# Patient Record
Sex: Male | Born: 1985 | Race: Black or African American | Hispanic: No | Marital: Married | Smoking: Current every day smoker
Health system: Southern US, Community
[De-identification: ages and names within clinical notes are randomized; demographics above are authoritative.]

## PROBLEM LIST (undated history)

## (undated) DIAGNOSIS — K219 Gastro-esophageal reflux disease without esophagitis: Secondary | ICD-10-CM

## (undated) DIAGNOSIS — G43909 Migraine, unspecified, not intractable, without status migrainosus: Secondary | ICD-10-CM

---
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
## 2012-12-02 ENCOUNTER — Emergency Department: Payer: Self-pay | Admitting: Emergency Medicine

## 2013-03-07 ENCOUNTER — Emergency Department: Payer: Self-pay | Admitting: Emergency Medicine

## 2013-12-08 ENCOUNTER — Emergency Department: Payer: Self-pay | Admitting: Emergency Medicine

## 2014-01-17 ENCOUNTER — Emergency Department: Payer: Self-pay | Admitting: Emergency Medicine

## 2014-04-14 ENCOUNTER — Emergency Department: Payer: Self-pay | Admitting: Emergency Medicine

## 2015-09-26 ENCOUNTER — Encounter: Payer: Self-pay | Admitting: Emergency Medicine

## 2015-09-26 ENCOUNTER — Emergency Department
Admission: EM | Admit: 2015-09-26 | Discharge: 2015-09-26 | Disposition: A | Discharge status: Home / Self Care | Payer: Self-pay | Attending: Emergency Medicine | Admitting: Emergency Medicine

## 2015-09-26 ENCOUNTER — Emergency Department: Payer: Self-pay

## 2015-09-26 DIAGNOSIS — F17210 Nicotine dependence, cigarettes, uncomplicated: Secondary | ICD-10-CM | POA: Insufficient documentation

## 2015-09-26 DIAGNOSIS — J069 Acute upper respiratory infection, unspecified: Secondary | ICD-10-CM

## 2015-09-26 HISTORY — DX: Migraine, unspecified, not intractable, without status migrainosus: G43.909

## 2015-09-26 LAB — CBC
HEMATOCRIT: 47 % (ref 40.0–52.0)
HEMOGLOBIN: 15.6 g/dL (ref 13.0–18.0)
MCH: 28.8 pg (ref 26.0–34.0)
MCHC: 33.2 g/dL (ref 32.0–36.0)
MCV: 86.9 fL (ref 80.0–100.0)
Platelets: 180 10*3/uL (ref 150–440)
RBC: 5.41 MIL/uL (ref 4.40–5.90)
RDW: 14.5 % (ref 11.5–14.5)
WBC: 9.2 10*3/uL (ref 3.8–10.6)

## 2015-09-26 LAB — BASIC METABOLIC PANEL
ANION GAP: 7 (ref 5–15)
BUN: 8 mg/dL (ref 6–20)
CHLORIDE: 107 mmol/L (ref 101–111)
CO2: 23 mmol/L (ref 22–32)
Calcium: 9.1 mg/dL (ref 8.9–10.3)
Creatinine, Ser: 0.85 mg/dL (ref 0.61–1.24)
GFR calc Af Amer: >60 mL/min (ref >60)
GLUCOSE: 86 mg/dL (ref 65–99)
POTASSIUM: 3.7 mmol/L (ref 3.5–5.1)
SODIUM: 137 mmol/L (ref 135–145)

## 2015-09-26 LAB — TROPONIN I: Troponin I: <0.03 ng/mL (ref <0.031)

## 2015-09-26 MED ORDER — ALBUTEROL SULFATE HFA 108 (90 BASE) MCG/ACT IN AERS
2.0000 | INHALATION_SPRAY | Freq: Four times a day (QID) | RESPIRATORY_TRACT | Status: AC | PRN
Start: 2015-09-26 — End: ?

## 2015-09-26 NOTE — ED Notes (Signed)
Pt ambulatory to restroom with steady gait noted  

## 2015-09-26 NOTE — ED Notes (Signed)
Discharge instructions reviewed with patient. Questions fielded by this RN. Patient verbalizes understanding of instructions. Patient discharged home in stable condition per Mcshane MD . No acute distress noted at time of discharge.   

## 2015-09-26 NOTE — ED Provider Notes (Signed)
[facility] Emergency Department Provider Note  ____________________________________________   I have reviewed the triage vital signs and the nursing notes.   HISTORY  Chief Complaint Chest Pain    HPI Gordon Dunn is a 30 y.o. male presents today with cough runny nose slight headache and nasal congestion for the last 3 days. He states he needs a work note.He states it hurts sometimes when he coughs.      Past Medical History  Diagnosis Date  . Migraines     There are no active problems to display for this patient.   History reviewed. No pertinent past surgical history.  No current outpatient prescriptions on file.  Allergies Review of patient's allergies indicates no known allergies.  No family history on file.  Social History Social History  Substance Use Topics  . Smoking status: Current Every Day Smoker -- 0.50 packs/day    Types: Cigarettes  . Smokeless tobacco: None  . Alcohol Use: No    Review of Systems Constitutional: No fever/chills Eyes: No visual changes. ENT: No sore throat. No stiff neck no neck pain Cardiovascular:See history of present illness. Respiratory: Denies shortness of breath. Gastrointestinal:   no vomiting.  No diarrhea.  No constipation. Genitourinary: Negative for dysuria. Musculoskeletal: Negative lower extremity swelling Skin: Negative for rash. Neurological: Negative for headaches, focal weakness or numbness. 10-point ROS otherwise negative.  ____________________________________________   PHYSICAL EXAM:  VITAL SIGNS: ED Triage Vitals  Enc Vitals Group     BP 09/26/15 1625 146/109 mmHg     Pulse Rate 09/26/15 1625 85     Resp 09/26/15 1625 18     Temp 09/26/15 1625 98.8 F (37.1 C)     Temp Source 09/26/15 1625 Oral     SpO2 09/26/15 1625 97 %     Weight 09/26/15 1625 165 lb (74.844 kg)     Height 09/26/15 1625 5\' 9"  (1.753 m)     Head Cir --      Peak Flow --      Pain Score  09/26/15 1626 4     Pain Loc --      Pain Edu? --      Excl. in GC? --     Constitutional: Alert and oriented. Well appearing and in no acute distress. Eyes: Conjunctivae are normal. PERRL. EOMI. Head: Atraumatic. Nose: Positive clear congestion/rhinnorhea. Mouth/Throat: Mucous membranes are moist.  Oropharynx non-erythematous. Neck: No stridor.   Nontender with no meningismus Cardiovascular: Normal rate, regular rhythm. Grossly normal heart sounds.  Good peripheral circulation. Chest: Tender to palpation in the left chest wall, minimally, review suspicions pain. What are serpiginous is "accessed pain right there". Respiratory: Normal respiratory effort.  No retractions. Lungs CTAB. Abdominal: Soft and nontender. No distention. No guarding no rebound Back:  There is no focal tenderness or step off there is no midline tenderness there are no lesions noted. there is no CVA tenderness Musculoskeletal: No lower extremity tenderness. No joint effusions, no DVT signs strong distal pulses no edema Neurologic:  Normal speech and language. No gross focal neurologic deficits are appreciated.  Skin:  Skin is warm, dry and intact. No rash noted. Psychiatric: Mood and affect are normal. Speech and behavior are normal.  ____________________________________________   LABS (all labs ordered are listed, but only abnormal results are displayed)  Labs Reviewed  BASIC METABOLIC PANEL  CBC  TROPONIN I   ____________________________________________  EKG  I personally interpreted any EKGs ordered by me or triage Normal sinus rhythm  rate 27 bpm no acute ST elevation or acute ST depression normal axis unremarkable EKG ____________________________________________  RADIOLOGY  I reviewed any imaging ordered by me or triage that were performed during my shift and, if possible, patient and/or family made aware of any abnormal  findings. ____________________________________________   PROCEDURES  Procedure(s) performed: None  Critical Care performed: None  ____________________________________________   INITIAL IMPRESSION / ASSESSMENT AND PLAN / ED COURSE  Pertinent labs & imaging results that were available during my care of the patient were reviewed by me and considered in my medical decision making (see chart for details).  Patient with no history of PE or DVT no risk factors for PE or DVT, who is perk negative presents today with reproducible chest wall pain, and URI symptoms. All this is consistent with a viral URI. We will treat her with albuterol that seemed to help his cough followed this time he is not wheezing. Patient is very well-appearing. 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  Despite best efforts to proofread,  errors can occur which can change meaning.     Jeanmarie Plant, MD 09/26/15 [phone]

## 2015-09-26 NOTE — ED Notes (Signed)
Patient presents to the ED with chest pain x 2-3 days with nasal congestion.  Patient reports pain when he tries to take a deep breath.  Patient reports slight headache at this time as well.  Patient denies increased pain with coughing.  Patient speaking in full sentences and respirations are even and nonlabored.

## 2015-09-26 NOTE — Discharge Instructions (Signed)
Cough, Adult °A cough helps to clear your throat and lungs. A cough may last only 2-3 weeks (acute), or it may last longer than 8 weeks (chronic). Many different things can cause a cough. A cough may be a sign of an illness or another medical condition. °HOME CARE °· Pay attention to any changes in your cough. °· Take medicines only as told by your doctor. °· If you were prescribed an antibiotic medicine, take it as told by your doctor. Do not stop taking it even if you start to feel better. °· Talk with your doctor before you try using a cough medicine. °· Drink enough fluid to keep your pee (urine) clear or pale yellow. °· If the air is dry, use a cold steam vaporizer or humidifier in your home. °· Stay away from things that make you cough at work or at home. °· If your cough is worse at night, try using extra pillows to raise your head up higher while you sleep. °· Do not smoke, and try not to be around smoke. If you need help quitting, ask your doctor. °· Do not have caffeine. °· Do not drink alcohol. °· Rest as needed. °GET HELP IF: °· You have new problems (symptoms). °· You cough up yellow fluid (pus). °· Your cough does not get better after 2-3 weeks, or your cough gets worse. °· Medicine does not help your cough and you are not sleeping well. °· You have pain that gets worse or pain that is not helped with medicine. °· You have a fever. °· You are losing weight and you do not know why. °· You have night sweats. °GET HELP RIGHT AWAY IF: °· You cough up blood. °· You have trouble breathing. °· Your heartbeat is very fast. °  °This information is not intended to replace advice given to you by your health care provider. Make sure you discuss any questions you have with your health care provider. °  °Document Released: 12/19/2010 Document Revised: 12/27/2014 Document Reviewed: 06/14/2014 °Elsevier Interactive Patient Education ©2016 Else[employer] ° °Upper Respiratory Infection, Adult °Most upper respiratory  infections (URIs) are a viral infection of the air passages leading to the lungs. A URI affects the nose, throat, and upper air passages. The most common type of URI is nasopharyngitis and is typically referred to as "the common cold." °URIs run their course and usually go away on their own. Most of the time, a URI does not require medical attention, but sometimes a bacterial infection in the upper airways can follow a viral infection. This is called a secondary infection. Sinus and middle ear infections are common types of secondary upper respiratory infections. °Bacterial pneumonia can also complicate a URI. A URI can worsen asthma and chronic obstructive pulmonary disease (COPD). Sometimes, these complications can require emergency medical care and may be life threatening.  °CAUSES °Almost all URIs are caused by viruses. A virus is a type of germ and can spread from one person to another.  °RISKS FACTORS °You may be at risk for a URI if:  °· You smoke.   °· You have chronic heart or lung disease. °· You have a weakened defense (immune) system.   °· You are very young or very old.   °· You have nasal allergies or asthma. °· You work in crowded or poorly ventilated areas. °· You work in health care facilities or schools. °SIGNS AND SYMPTOMS  °Symptoms typically develop 2-3 days after you come in contact with a cold virus.   Most viral URIs last 7-10 days. However, viral URIs from the influenza virus (flu virus) can last 14-18 days and are typically more severe. Symptoms may include:  °· Runny or stuffy (congested) nose.   °· Sneezing.   °· Cough.   °· Sore throat.   °· Headache.   °· Fatigue.   °· Fever.   °· Loss of appetite.   °· Pain in your forehead, behind your eyes, and over your cheekbones (sinus pain). °· Muscle aches.   °DIAGNOSIS  °Your health care provider may diagnose a URI by: °· Physical exam. °· Tests to check that your symptoms are not due to another condition such as: °¨ Strep  throat. °¨ Sinusitis. °¨ Pneumonia. °¨ Asthma. °TREATMENT  °A URI goes away on its own with time. It cannot be cured with medicines, but medicines may be prescribed or recommended to relieve symptoms. Medicines may help: °· Reduce your fever. °· Reduce your cough. °· Relieve nasal congestion. °HOME CARE INSTRUCTIONS  °· Take medicines only as directed by your health care provider.   °· Gargle warm saltwater or take cough drops to comfort your throat as directed by your health care provider. °· Use a warm mist humidifier or inhale steam from a shower to increase air moisture. This may make it easier to breathe. °· Drink enough fluid to keep your urine clear or pale yellow.   °· Eat soups and other clear broths and maintain good nutrition.   °· Rest as needed.   °· Return to work when your temperature has returned to normal or as your health care provider advises. You may need to stay home longer to avoid infecting others. You can also use a face mask and careful hand washing to prevent spread of the virus. °· Increase the usage of your inhaler if you have asthma.   °· Do not use any tobacco products, including cigarettes, chewing tobacco, or electronic cigarettes. If you need help quitting, ask your health care provider. °PREVENTION  °The best way to protect yourself from getting a cold is to practice good hygiene.  °· Avoid oral or hand contact with people with cold symptoms.   °· Wash your hands often if contact occurs.   °There is no clear evidence that vitamin C, vitamin E, echinacea, or exercise reduces the chance of developing a cold. However, it is always recommended to get plenty of rest, exercise, and practice good nutrition.  °SEEK MEDICAL CARE IF:  °· You are getting worse rather than better.   °· Your symptoms are not controlled by medicine.   °· You have chills. °· You have worsening shortness of breath. °· You have brown or red mucus. °· You have yellow or brown nasal discharge. °· You have pain in your  face, especially when you bend forward. °· You have a fever. °· You have swollen neck glands. °· You have pain while swallowing. °· You have white areas in the back of your throat. °SEEK IMMEDIATE MEDICAL CARE IF:  °· You have severe or persistent: °¨ Headache. °¨ Ear pain. °¨ Sinus pain. °¨ Chest pain. °· You have chronic lung disease and any of the following: °¨ Wheezing. °¨ Prolonged cough. °¨ Coughing up blood. °¨ A change in your usual mucus. °· You have a stiff neck. °· You have changes in your: °¨ Vision. °¨ Hearing. °¨ Thinking. °¨ Mood. °MAKE SURE YOU:  °· Understand these instructions. °· Will watch your condition. °· Will get help right away if you are not doing well or get worse. °  °This information is not intended to replace   advice given to you by your health care provider. Make sure you discuss any questions you have with your health care provider. °  °Document Released: 10/01/2000 Document Revised: 08/22/2014 Document Reviewed: 07/13/2013 °Elsevier Interactive Patient Education ©2016 Elsevier Inc. ° °

## 2015-10-24 ENCOUNTER — Encounter: Payer: Self-pay | Admitting: *Deleted

## 2015-10-24 ENCOUNTER — Emergency Department: Payer: Self-pay

## 2015-10-24 ENCOUNTER — Emergency Department
Admission: EM | Admit: 2015-10-24 | Discharge: 2015-10-24 | Disposition: A | Discharge status: Home / Self Care | Payer: Self-pay | Attending: Emergency Medicine | Admitting: Emergency Medicine

## 2015-10-24 DIAGNOSIS — Y9389 Activity, other specified: Secondary | ICD-10-CM | POA: Insufficient documentation

## 2015-10-24 DIAGNOSIS — X500XXA Overexertion from strenuous movement or load, initial encounter: Secondary | ICD-10-CM | POA: Insufficient documentation

## 2015-10-24 DIAGNOSIS — Y999 Unspecified external cause status: Secondary | ICD-10-CM | POA: Insufficient documentation

## 2015-10-24 DIAGNOSIS — S39012A Strain of muscle, fascia and tendon of lower back, initial encounter: Secondary | ICD-10-CM | POA: Insufficient documentation

## 2015-10-24 DIAGNOSIS — Y92009 Unspecified place in unspecified non-institutional (private) residence as the place of occurrence of the external cause: Secondary | ICD-10-CM | POA: Insufficient documentation

## 2015-10-24 DIAGNOSIS — F17210 Nicotine dependence, cigarettes, uncomplicated: Secondary | ICD-10-CM | POA: Insufficient documentation

## 2015-10-24 MED ORDER — CYCLOBENZAPRINE HCL 5 MG PO TABS: 5.0000 mg | ORAL_TABLET | Freq: Three times a day (TID) | ORAL | Status: DC | PRN

## 2015-10-24 MED ORDER — NAPROXEN 500 MG PO TBEC: 500.0000 mg | DELAYED_RELEASE_TABLET | Freq: Two times a day (BID) | ORAL | Status: DC

## 2015-10-24 MED ORDER — KETOROLAC TROMETHAMINE 60 MG/2ML IM SOLN
30.0000 mg | Freq: Once | INTRAMUSCULAR | Status: AC
  Administered 2015-10-24: 30 mg via INTRAMUSCULAR
  Filled 2015-10-24: qty 2

## 2015-10-24 NOTE — Discharge Instructions (Signed)
Your exam and x-ray are negative for any serious bony injury to your back. You have a strain to the muscles of the lower back. Take the prescription meds as directed. Apply ice to the sore muscles and follow-up with [facility] for continued symptoms.   Back Injury Prevention Back injuries can be very painful. They can also be difficult to heal. After having one back injury, you are more likely to injure your back again. It is important to learn how to avoid injuring or re-injuring your back. The following tips can help you to prevent a back injury. WHAT SHOULD I KNOW ABOUT PHYSICAL FITNESS?  Exercise for 30 minutes per day on most days of the week or as directed by your health care provider. Make sure to:  Do aerobic exercises, such as walking, jogging, biking, or swimming.  Do exercises that increase balance and strength, such as tai chi and yoga. These can decrease your risk of falling and injuring your back.  Do stretching exercises to help with flexibility.  Try to develop strong abdominal muscles. Your abdominal muscles provide a lot of the support that is needed by your back.  Maintain a healthy weight. This helps to decrease your risk of a back injury. WHAT SHOULD I KNOW ABOUT MY DIET?  Talk with your health care provider about your overall diet. Take supplements and vitamins only as directed by your health care provider.  Talk with your health care provider about how much calcium and vitamin D you need each day. These nutrients help to prevent weakening of the bones (osteoporosis). Osteoporosis can cause broken (fractured) bones, which lead to back pain.  Include good sources of calcium in your diet, such as dairy products, green leafy vegetables, and products that have had calcium added to them (fortified).  Include good sources of vitamin D in your diet, such as milk and foods that are fortified with vitamin D. WHAT SHOULD I KNOW ABOUT MY POSTURE?  Sit up  straight and stand up straight. Avoid leaning forward when you sit or hunching over when you stand.  Choose chairs that have good low-back (lumbar) support.  If you work at a desk, sit close to it so you do not need to lean over. Keep your chin tucked in. Keep your neck drawn back, and keep your elbows bent at a right angle. Your arms should look like the letter "L."  Sit high and close to the steering wheel when you drive. Add a lumbar support to your car seat, if needed.  Avoid sitting or standing in one position for very long. Take breaks to get up, stretch, and walk around at least one time every hour. Take breaks every hour if you are driving for long periods of time.  Sleep on your side with your knees slightly bent, or sleep on your back with a pillow under your knees. Do not lie on the front of your body to sleep. WHAT SHOULD I KNOW ABOUT LIFTING, TWISTING, AND REACHING? Lifting and Heavy Lifting  Avoid heavy lifting, especially repetitive heavy lifting. If you must do heavy lifting:  Stretch before lifting.  Work slowly.  Rest between lifts.  Use a tool such as a cart or a dolly to move objects if one is available.  Make several small trips instead of carrying one heavy load.  Ask for help when you need it, especially when moving big objects.  Follow these steps when lifting:  Stand with your feet shoulder-width apart.  Get as close to the object as you can. Do not try to pick up a heavy object that is far from your body.  Use handles or lifting straps if they are available.  Bend at your knees. Squat down, but keep your heels off the floor.  Keep your shoulders pulled back, your chin tucked in, and your back straight.  Lift the object slowly while you tighten the muscles in your legs, abdomen, and buttocks. Keep the object as close to the center of your body as possible.  Follow these steps when putting down a heavy load:  Stand with your feet shoulder-width  apart.  Lower the object slowly while you tighten the muscles in your legs, abdomen, and buttocks. Keep the object as close to the center of your body as possible.  Keep your shoulders pulled back, your chin tucked in, and your back straight.  Bend at your knees. Squat down, but keep your heels off the floor.  Use handles or lifting straps if they are available. Twisting and Reaching  Avoid lifting heavy objects above your waist.  Do not twist at your waist while you are lifting or carrying a load. If you need to turn, move your feet.  Do not bend over without bending at your knees.  Avoid reaching over your head, across a table, or for an object on a high surface. WHAT ARE SOME OTHER TIPS?  Avoid wet floors and icy ground. Keep sidewalks clear of ice to prevent falls.  Do not sleep on a mattress that is too soft or too hard.  Keep items that are used frequently within easy reach.  Put heavier objects on shelves at waist level, and put lighter objects on lower or higher shelves.  Find ways to decrease your stress, such as exercise, massage, or relaxation techniques. Stress can build up in your muscles. Tense muscles are more vulnerable to injury.  Talk with your health care provider if you feel anxious or depressed. These conditions can make back pain worse.  Wear flat heel shoes with cushioned soles.  Avoid sudden movements.  Use both shoulder straps when carrying a backpack.  Do not use any tobacco products, including cigarettes, chewing tobacco, or electronic cigarettes. If you need help quitting, ask your health care provider.   This information is not intended to replace advice given to you by your health care provider. Make sure you discuss any questions you have with your health care provider.   Document Released: 05/15/2004 Document Revised: 08/22/2014 Document Reviewed: 04/11/2014 Elsevier Interactive Patient Education 2016 Elsevier Inc.  Lumbosacral  Strain Lumbosacral strain is a strain of any of the parts that make up your lumbosacral vertebrae. Your lumbosacral vertebrae are the bones that make up the lower third of your backbone. Your lumbosacral vertebrae are held together by muscles and tough, fibrous tissue (ligaments).  CAUSES  A sudden blow to your back can cause lumbosacral strain. Also, anything that causes an excessive stretch of the muscles in the low back can cause this strain. This is typically seen when people exert themselves strenuously, fall, lift heavy objects, bend, or crouch repeatedly. RISK FACTORS  Physically demanding work.  Participation in pushing or pulling sports or sports that require a sudden twist of the back (tennis, golf, baseball).  Weight lifting.  Excessive lower back curvature.  Forward-tilted pelvis.  Weak back or abdominal muscles or both.  Tight hamstrings. SIGNS AND SYMPTOMS  Lumbosacral strain may cause pain in the area of your injury  or pain that moves (radiates) down your leg.  DIAGNOSIS Your health care provider can often diagnose lumbosacral strain through a physical exam. In some cases, you may need tests such as X-ray exams.  TREATMENT  Treatment for your lower back injury depends on many factors that your clinician will have to evaluate. However, most treatment will include the use of anti-inflammatory medicines. HOME CARE INSTRUCTIONS   Avoid hard physical activities (tennis, racquetball, waterskiing) if you are not in proper physical condition for it. This may aggravate or create problems.  If you have a back problem, avoid sports requiring sudden body movements. Swimming and walking are generally safer activities.  Maintain good posture.  Maintain a healthy weight.  For acute conditions, you may put ice on the injured area.  Put ice in a plastic bag.  Place a towel between your skin and the bag.  Leave the ice on for 20 minutes, 2-3 times a day.  When the low back  starts healing, stretching and strengthening exercises may be recommended. SEEK MEDICAL CARE IF:  Your back pain is getting worse.  You experience severe back pain not relieved with medicines. SEEK IMMEDIATE MEDICAL CARE IF:   You have numbness, tingling, weakness, or problems with the use of your arms or legs.  There is a change in bowel or bladder control.  You have increasing pain in any area of the body, including your belly (abdomen).  You notice shortness of breath, dizziness, or feel faint.  You feel sick to your stomach (nauseous), are throwing up (vomiting), or become sweaty.  You notice discoloration of your toes or legs, or your feet get very cold. MAKE SURE YOU:   Understand these instructions.  Will watch your condition.  Will get help right away if you are not doing well or get worse.   This information is not intended to replace advice given to you by your health care provider. Make sure you discuss any questions you have with your health care provider.   Document Released: 01/15/2005 Document Revised: 04/28/2014 Document Reviewed: 11/24/2012 Elsevier Interactive Patient Education [employer].
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
## 2016-06-12 ENCOUNTER — Emergency Department
Admission: EM | Admit: 2016-06-12 | Discharge: 2016-06-12 | Disposition: A | Discharge status: Home / Self Care | Payer: Self-pay | Attending: Emergency Medicine | Admitting: Emergency Medicine

## 2016-06-12 ENCOUNTER — Emergency Department: Payer: Self-pay

## 2016-06-12 DIAGNOSIS — K2100 Gastro-esophageal reflux disease with esophagitis, without bleeding: Secondary | ICD-10-CM

## 2016-06-12 DIAGNOSIS — Z79899 Other long term (current) drug therapy: Secondary | ICD-10-CM | POA: Insufficient documentation

## 2016-06-12 DIAGNOSIS — F17210 Nicotine dependence, cigarettes, uncomplicated: Secondary | ICD-10-CM | POA: Insufficient documentation

## 2016-06-12 LAB — COMPREHENSIVE METABOLIC PANEL
ALBUMIN: 4.1 g/dL (ref 3.5–5.0)
ALK PHOS: 61 U/L (ref 38–126)
ALT: 29 U/L (ref 17–63)
ANION GAP: 6 (ref 5–15)
AST: 25 U/L (ref 15–41)
BUN: 15 mg/dL (ref 6–20)
CALCIUM: 8.8 mg/dL — AB (ref 8.9–10.3)
CO2: 25 mmol/L (ref 22–32)
Chloride: 106 mmol/L (ref 101–111)
Creatinine, Ser: 0.83 mg/dL (ref 0.61–1.24)
GFR calc Af Amer: >60 mL/min (ref >60)
GFR calc non Af Amer: >60 mL/min (ref >60)
GLUCOSE: 110 mg/dL — AB (ref 65–99)
POTASSIUM: 4.1 mmol/L (ref 3.5–5.1)
SODIUM: 137 mmol/L (ref 135–145)
Total Bilirubin: 0.4 mg/dL (ref 0.3–1.2)
Total Protein: 6.9 g/dL (ref 6.5–8.1)

## 2016-06-12 LAB — CBC WITH DIFFERENTIAL/PLATELET
Basophils Absolute: 0 10*3/uL (ref 0–0.1)
Basophils Relative: 1 %
Eosinophils Absolute: 0.2 10*3/uL (ref 0–0.7)
Eosinophils Relative: 3 %
HCT: 43.6 % (ref 40.0–52.0)
Hemoglobin: 14.8 g/dL (ref 13.0–18.0)
Lymphocytes Relative: 27 %
Lymphs Abs: 1.9 10*3/uL (ref 1.0–3.6)
MCH: 29.7 pg (ref 26.0–34.0)
MCHC: 33.8 g/dL (ref 32.0–36.0)
MCV: 87.8 fL (ref 80.0–100.0)
Monocytes Absolute: 1.1 10*3/uL — ABNORMAL HIGH (ref 0.2–1.0)
Monocytes Relative: 15 %
Neutro Abs: 3.9 10*3/uL (ref 1.4–6.5)
Neutrophils Relative %: 54 %
Platelets: 183 10*3/uL (ref 150–440)
RBC: 4.97 MIL/uL (ref 4.40–5.90)
RDW: 15.9 % — ABNORMAL HIGH (ref 11.5–14.5)
WBC: 7.1 10*3/uL (ref 3.8–10.6)

## 2016-06-12 LAB — TROPONIN I: Troponin I: <0.03 ng/mL (ref <0.03)

## 2016-06-12 MED ORDER — PANTOPRAZOLE SODIUM 20 MG PO TBEC: 20.0000 mg | DELAYED_RELEASE_TABLET | Freq: Every day | ORAL | 1 refills | Status: DC

## 2016-06-12 MED ORDER — GI COCKTAIL ~~LOC~~
30.0000 mL | Freq: Once | ORAL | Status: AC
  Administered 2016-06-12: 30 mL via ORAL
  Filled 2016-06-12: qty 30

## 2016-06-12 NOTE — ED Provider Notes (Signed)
Time Seen: Approximately0751  I have reviewed the triage notes  Chief Complaint: Chest Pain and Heartburn   History of Present Illness: Gordon Dunn is a 31 y.o. male *who presents with some intermittent substernal chest discomfort with feelings of nausea with reflux symptoms of feeling acid up into the throat and anterior neck pain. He states he's noticed the pain at night and early in the morning. He denies any melena or hematochezia. He denies any radiation into the arm or jaw area. He denies any back or flank discomfort. Eyes any illicit drug usage or any significant past medical history   Past Medical History:  Diagnosis Date  . Migraines     There are no active problems to display for this patient.   No past surgical history on file.  No past surgical history on file.  Current Outpatient Rx  . Order #: [id number] Class: Print  . Order #: [id number] Class: Print  . Order #: [id number] Class: Print  . Order #: [id number] Class: Print    Allergies:  Patient has no known allergies.  Family History: No family history on file. No early cardiovascular disease Social History: Social History  Substance Use Topics  . Smoking status: Current Every Day Smoker    Packs/day: 0.50    Types: Cigarettes  . Smokeless tobacco: Never Used  . Alcohol use No     Review of Systems:   10 point review of systems was performed and was otherwise negative:  Constitutional: No fever Eyes: No visual disturbances ENT: No sore throat, ear pain Cardiac: No chest pain Respiratory: No shortness of breath, wheezing, or stridor Abdomen: Patient primarily points to the epigastric area and occasionally in the left upper quadrant source of discomfort. Endocrine: No weight loss, No night sweats Extremities: No peripheral edema, cyanosis Skin: No rashes, easy bruising Neurologic: No focal weakness, trouble with speech or swollowing Urologic: No dysuria, Hematuria, or urinary frequency He  denies any melena, hematochezia  Physical Exam:  ED Triage Vitals  Enc Vitals Group     BP 06/12/16 0743 (!) 110/97     Pulse Rate 06/12/16 0743 81     Resp 06/12/16 0743 18     Temp 06/12/16 0743 98.6 F (37 C)     Temp Source 06/12/16 0743 Oral     SpO2 06/12/16 0743 97 %     Weight 06/12/16 0744 173 lb (78.5 kg)     Height 06/12/16 0744 5\' 9"  (1.753 m)     Head Circumference --      Peak Flow --      Pain Score 06/12/16 0744 8     Pain Loc --      Pain Edu? --      Excl. in GC? --     General: Awake , Alert , and Oriented times 3; GCS 15 Head: Normal cephalic , atraumatic Eyes: Pupils equal , round, reactive to light Nose/Throat: No nasal drainage, patent upper airway without erythema or exudate.  Neck: Supple, Full range of motion, No anterior adenopathy or palpable thyroid masses Lungs: Clear to ascultation without wheezes , rhonchi, or rales Heart: Regular rate, regular rhythm without murmurs , gallops , or rubs Abdomen: Soft, non tender without rebound, guarding , or rigidity; bowel sounds positive and symmetric in all 4 quadrants. No organomegaly .        Extremities: 2 plus symmetric pulses. No edema, clubbing or cyanosis Neurologic: normal ambulation, Motor symmetric without deficits, sensory intact Skin: warm, dry, no rashes  Labs:   All laboratory work was reviewed including any pertinent negatives or positives listed below:  Labs Reviewed  CBC WITH DIFFERENTIAL/PLATELET - Abnormal; Notable for the following:       Result Value   RDW 15.9 (*)    Monocytes Absolute 1.1 (*)    All other components within normal limits  COMPREHENSIVE METABOLIC PANEL - Abnormal; Notable for the following:    Glucose, Bld 110 (*)    Calcium 8.8 (*)    All other components within normal limits  TROPONIN I  Laboratory work was reviewed and showed no clinically significant abnormalities.   EKG:  ED ECG REPORT I, Jennye Moccasin, the attending physician, personally viewed  and interpreted this ECG.  Date: 06/12/2016 EKG Time: *0754 Rate: 76Rhythm: normal sinus rhythm QRS Axis: normal Intervals: normal ST/T Wave abnormalities: normal Conduction Disturbances: none Narrative Interpretation: unremarkable Early repolarization No acute ischemic changes   Radiology:  "Dg Chest 2 View  Result Date: 06/12/2016 CLINICAL DATA:  Increased severity of chest pain with vomiting, reflux, and headache today. Patient has had intermittent similar symptoms over several months. Current smoker. No known cardiopulmonary or abdominal issues. EXAM: CHEST  2 VIEW COMPARISON:  Chest x-ray of September 26, 2015 FINDINGS: The lungs are adequately inflated and clear. The heart and pulmonary vascularity are normal. The mediastinum is normal in width. There is no pleural effusion. The bony thorax exhibits no acute abnormality. IMPRESSION: There is no pneumonia nor other acute cardiopulmonary abnormality. Electronically Signed   By: David  [place] M.D.   On: 06/12/2016 08:20  " I personally reviewed the radiologic studies   ED Course:  Differential includes all life-threatening causes for chest pain. This includes but is not exclusive to acute coronary syndrome, aortic dissection, pulmonary embolism, cardiac tamponade, community-acquired pneumonia, pericarditis, musculoskeletal chest wall pain, etc. Given the patient's current clinical presentation, objective findings, and risk factors, I felt this was unlikely to be a life-threatening cause of chest pain. Given the nature of his discomfort is mostly in the morning and in the evening. This most likely is esophageal reflux disease. Patient was referred to GI unassigned. He is been advised to take Tylenol over-the-counter for pain and avoid nonsteroidal products.     Final Clinical Impression:  Final diagnoses:  Reflux esophagitis     Plan: * Outpatient " New Prescriptions   PANTOPRAZOLE (PROTONIX) 20 MG TABLET    Take 1 tablet (20 mg  total) by mouth daily.  " Patient was advised to return immediately if condition worsens. Patient was advised to follow up with their primary care physician or other specialized physicians involved in their outpatient care. The patient and/or family member/power of attorney had laboratory results reviewed at the bedside. All questions and concerns were addressed and appropriate discharge instructions were distributed by the nursing staff.            Jennye Moccasin, MD 06/12/16 [id number]

## 2016-06-12 NOTE — ED Triage Notes (Signed)
Pt arrives ambulatory to triage with reports of left sided chest pain and abdominal pain  Pt reports it has been happening for approx one month pain is reported as burning and his abdomen gets distended   Pain is worse in the am

## 2016-06-12 NOTE — Discharge Instructions (Signed)
Please return immediately if condition worsens. Please contact her primary physician or the physician you were given for referral. If you have any specialist physicians involved in her treatment and plan please also contact them. Thank you for using [place] [facility] emergency Department. ° °

## 2016-06-12 NOTE — ED Notes (Signed)
Pt to xray

## 2016-07-04 ENCOUNTER — Encounter: Payer: Self-pay | Admitting: Emergency Medicine

## 2016-07-04 ENCOUNTER — Emergency Department
Admission: EM | Admit: 2016-07-04 | Discharge: 2016-07-04 | Disposition: A | Discharge status: Home / Self Care | Payer: Self-pay | Attending: Emergency Medicine | Admitting: Emergency Medicine

## 2016-07-04 DIAGNOSIS — A630 Anogenital (venereal) warts: Secondary | ICD-10-CM | POA: Insufficient documentation

## 2016-07-04 DIAGNOSIS — B372 Candidiasis of skin and nail: Secondary | ICD-10-CM | POA: Insufficient documentation

## 2016-07-04 DIAGNOSIS — F17210 Nicotine dependence, cigarettes, uncomplicated: Secondary | ICD-10-CM | POA: Insufficient documentation

## 2016-07-04 HISTORY — DX: Gastro-esophageal reflux disease without esophagitis: K21.9

## 2016-07-04 MED ORDER — IMIQUIMOD 5 % EX CREA: TOPICAL_CREAM | CUTANEOUS | 4 refills | Status: DC

## 2016-07-04 MED ORDER — NYSTATIN 100000 UNIT/GM EX CREA: 1.0000 "application " | TOPICAL_CREAM | Freq: Two times a day (BID) | CUTANEOUS | 0 refills | Status: DC

## 2016-07-04 NOTE — ED Provider Notes (Signed)
[facility] Emergency Department Provider Note  ____________________________________________  Time seen: Approximately 11:25 PM  I have reviewed the triage vital signs and the nursing notes.   HISTORY  Chief Complaint Rash    HPI Gordon Dunn is a 31 y.o. male who presents emergency department complaining of a rash to the groin and perirectal area along with "white bumps" around anus. Patient reports his symptoms have been ongoing times several days. He denies any fevers or chills, abdominal pain, rectal bleeding. No medications for this complaint prior to arrival. Patient denies any dysuria, pyuria, hematuria, rectal bleeding.   Past Medical History:  Diagnosis Date  . GERD (gastroesophageal reflux disease)   . Migraines     There are no active problems to display for this patient.   History reviewed. No pertinent surgical history.  Prior to Admission medications   Medication Sig Start Date End Date Taking? Authorizing Provider  albuterol (PROVENTIL HFA;VENTOLIN HFA) 108 (90 Base) MCG/ACT inhaler Inhale 2 puffs into the lungs every 6 (six) hours as needed for wheezing or shortness of breath. 09/26/15   Jeanmarie PlantJames A McShane, MD  cyclobenzaprine (FLEXERIL) 5 MG tablet Take 1 tablet (5 mg total) by mouth 3 (three) times daily as needed for muscle spasms. 10/24/15   Jenise V Bacon Menshew, PA-C  imiquimod (ALDARA) 5 % cream Apply to affected area three times weekly 07/04/16 07/04/17  Christiane HaJonathan D Chloe Miyoshi, PA-C  naproxen (EC NAPROSYN) 500 MG EC tablet Take 1 tablet (500 mg total) by mouth 2 (two) times daily with a meal. 10/24/15   Jenise V Bacon Menshew, PA-C  nystatin cream (MYCOSTATIN) Apply 1 application topically 2 (two) times daily. 07/04/16   Delorise RoyalsJonathan D Tennyson Wacha, PA-C  pantoprazole (PROTONIX) 20 MG tablet Take 1 tablet (20 mg total) by mouth daily. 06/12/16 06/12/17  Jennye MoccasinBrian S Quigley, MD    Allergies Patient has no known allergies.  History reviewed. No  pertinent family history.  Social History Social History  Substance Use Topics  . Smoking status: Current Every Day Smoker    Packs/day: 0.50    Types: Cigarettes  . Smokeless tobacco: Never Used  . Alcohol use Yes     Review of Systems  Constitutional: No fever/chills Cardiovascular: no chest pain. Respiratory: no cough. No SOB. Gastrointestinal: No abdominal pain.  No nausea, no vomiting.  No diarrhea.  No constipation. Genitourinary: Negative for dysuria. No hematuria Musculoskeletal: Negative for musculoskeletal pain. Skin: Positive for rash in the groin. Anal region. Patient also has 2 white bumps in the perianal region. Neurological: Negative for headaches, focal weakness or numbness. 10-point ROS otherwise negative.  ____________________________________________   PHYSICAL EXAM:  VITAL SIGNS: ED Triage Vitals  Enc Vitals Group     BP 07/04/16 2151 134/71     Pulse Rate 07/04/16 2151 90     Resp 07/04/16 2151 16     Temp 07/04/16 2151 98.7 F (37.1 C)     Temp Source 07/04/16 2151 Oral     SpO2 07/04/16 2151 96 %     Weight 07/04/16 2152 180 lb (81.6 kg)     Height 07/04/16 2152 5\' 9"  (1.753 m)     Head Circumference --      Peak Flow --      Pain Score 07/04/16 2152 6     Pain Loc --      Pain Edu? --      Excl. in GC? --      Constitutional: Alert and oriented. Well appearing  and in no acute distress. Eyes: Conjunctivae are normal. PERRL. EOMI. Head: Atraumatic. Neck: No stridor.    Cardiovascular: Normal rate, regular rhythm. Normal S1 and S2.  Good peripheral circulation. Respiratory: Normal respiratory effort without tachypnea or retractions. Lungs CTAB. Good air entry to the bases with no decreased or absent breath sounds. Gastrointestinal: Bowel sounds 4 quadrants. Soft and nontender to palpation. No guarding or rigidity. No palpable masses. No distention.  Musculoskeletal: Full range of motion to all extremities. No gross deformities  appreciated. Neurologic:  Normal speech and language. No gross focal neurologic deficits are appreciated.  Skin:  Skin is warm, dry and intact. Patient has rash in the inguinal region and the gluteal cleft consistent with candidal infection. 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 No signs of hemorrhoids. Psychiatric: Mood and affect are normal. Speech and behavior are normal. Patient exhibits appropriate insight and judgement.   ____________________________________________   LABS (all labs ordered are listed, but only abnormal results are displayed)  Labs Reviewed - No data to display ____________________________________________  EKG   ____________________________________________  RADIOLOGY   No results found.  ____________________________________________    PROCEDURES  Procedure(s) performed:    Procedures    Medications - No data to display   ____________________________________________   INITIAL IMPRESSION / ASSESSMENT AND PLAN / ED COURSE  Pertinent labs & imaging results that were available during my care of the patient were reviewed by me and considered in my medical decision making (see chart for details).  Review of the [place] CSRS was performed in accordance of the [facility] prior to dispensing any controlled drugs.     Patient's diagnosis is consistent with condyloma and candidal skin infection. Patient will be discharged home with prescriptions for imiquimod and nystatin cream. Patient is to follow up with primary care as needed or otherwise directed. Patient is given ED precautions to return to the ED for any worsening or new symptoms.     ____________________________________________  FINAL CLINICAL IMPRESSION(S) / ED DIAGNOSES  Final diagnoses:  Candidal skin infection  Condyloma      NEW MEDICATIONS STARTED DURING THIS VISIT:  New Prescriptions   IMIQUIMOD (ALDARA) 5 % CREAM    Apply to affected area three times weekly    NYSTATIN CREAM (MYCOSTATIN)    Apply 1 application topically 2 (two) times daily.        This chart was dictated using voice recognition software/Dragon. Despite best efforts to proofread, errors can occur which can change the meaning. Any change was purely unintentional.    Racheal Patches, PA-C 07/04/16 1610    Minna Antis, MD 07/04/16 2356

## 2016-07-04 NOTE — ED Triage Notes (Signed)
Pt ambulatory to triage in NAD, report skin irritation and chaffing around groin and anus, reports protrusion from anus, white in color.

## 2016-10-08 ENCOUNTER — Emergency Department
Admission: EM | Admit: 2016-10-08 | Discharge: 2016-10-08 | Disposition: A | Discharge status: Home / Self Care | Payer: Self-pay | Attending: Emergency Medicine | Admitting: Emergency Medicine

## 2016-10-08 ENCOUNTER — Encounter: Payer: Self-pay | Admitting: *Deleted

## 2016-10-08 DIAGNOSIS — Y9389 Activity, other specified: Secondary | ICD-10-CM | POA: Insufficient documentation

## 2016-10-08 DIAGNOSIS — Y999 Unspecified external cause status: Secondary | ICD-10-CM | POA: Insufficient documentation

## 2016-10-08 DIAGNOSIS — S0001XA Abrasion of scalp, initial encounter: Secondary | ICD-10-CM | POA: Insufficient documentation

## 2016-10-08 DIAGNOSIS — S0003XA Contusion of scalp, initial encounter: Secondary | ICD-10-CM

## 2016-10-08 DIAGNOSIS — Y929 Unspecified place or not applicable: Secondary | ICD-10-CM | POA: Insufficient documentation

## 2016-10-08 DIAGNOSIS — F17210 Nicotine dependence, cigarettes, uncomplicated: Secondary | ICD-10-CM | POA: Insufficient documentation

## 2016-10-08 DIAGNOSIS — W2209XA Striking against other stationary object, initial encounter: Secondary | ICD-10-CM | POA: Insufficient documentation

## 2016-10-08 DIAGNOSIS — Z79899 Other long term (current) drug therapy: Secondary | ICD-10-CM | POA: Insufficient documentation

## 2016-10-08 DIAGNOSIS — S0990XA Unspecified injury of head, initial encounter: Secondary | ICD-10-CM

## 2016-10-08 NOTE — ED Provider Notes (Signed)
[facility] Emergency Department Provider Note  ____________________________________________  Time seen: Approximately 9:38 PM  I have reviewed the triage vital signs and the nursing notes.   HISTORY  Chief Complaint Head Injury    HPI Gordon Dunn is a 31 y.o. male who presents to the emergency department complaining of abrasion to the posterior of his head after hitting his head on a cabinet. Patient reports that he had bent over to place an item in his TV cabinet And When He Stood up He Struck the Back of His Head against the Corner of [employer]. Patient Reports That the Area Is Tender to Palpation and He Did Have an Abrasion with Minor Bleeding. Patient Denies Any Loss of Consciousness. He Denies Any Headache, Visual Changes, Neck Pain.Patient's tetanus shot is up-to-date. Patient denies any radicular symptoms. No complaints at this time. No medications prior to arrival.   Past Medical History:  Diagnosis Date  . GERD (gastroesophageal reflux disease)   . Migraines     There are no active problems to display for this patient.   No past surgical history on file.  Prior to Admission medications   Medication Sig Start Date End Date Taking? Authorizing Provider  albuterol (PROVENTIL HFA;VENTOLIN HFA) 108 (90 Base) MCG/ACT inhaler Inhale 2 puffs into the lungs every 6 (six) hours as needed for wheezing or shortness of breath. 09/26/15   Jeanmarie PlantMcShane, James A, MD  cyclobenzaprine (FLEXERIL) 5 MG tablet Take 1 tablet (5 mg total) by mouth 3 (three) times daily as needed for muscle spasms. 10/24/15   Menshew, Charlesetta IvoryJenise V Bacon, PA-C  imiquimod (ALDARA) 5 % cream Apply to affected area three times weekly 07/04/16 07/04/17  Kamdon Reisig, Delorise RoyalsJonathan D, PA-C  naproxen (EC NAPROSYN) 500 MG EC tablet Take 1 tablet (500 mg total) by mouth 2 (two) times daily with a meal. 10/24/15   Menshew, Charlesetta IvoryJenise V Bacon, PA-C  nystatin cream (MYCOSTATIN) Apply 1 application topically 2 (two) times  daily. 07/04/16   Naveena Eyman, Delorise RoyalsJonathan D, PA-C  pantoprazole (PROTONIX) 20 MG tablet Take 1 tablet (20 mg total) by mouth daily. 06/12/16 06/12/17  Jennye MoccasinQuigley, Brian S, MD    Allergies Patient has no known allergies.  No family history on file.  Social History Social History  Substance Use Topics  . Smoking status: Current Every Day Smoker    Packs/day: 0.50    Types: Cigarettes  . Smokeless tobacco: Never Used  . Alcohol use Yes     Review of Systems  Constitutional: No fever/chills Eyes: No visual changes. No discharge ENT: No upper respiratory complaints. Cardiovascular: no chest pain. Respiratory: no cough. No SOB. Gastrointestinal: No abdominal pain.  No nausea, no vomiting.   Musculoskeletal: Negative for musculoskeletal pain. Skin: Positive for laceration to the posterior scalp. Neurological: Negative for headaches, focal weakness or numbness. 10-point ROS otherwise negative.  ____________________________________________   PHYSICAL EXAM:  VITAL SIGNS: ED Triage Vitals [10/08/16 2034]  Enc Vitals Group     BP (!) 143/76     Pulse Rate (!) 106     Resp 18     Temp 98.6 F (37 C)     Temp Source Oral     SpO2 97 %     Weight 180 lb (81.6 kg)     Height 5\' 9"  (1.753 m)     Head Circumference      Peak Flow      Pain Score 10     Pain Loc  Pain Edu?      Excl. in GC?      Constitutional: Alert and oriented. Well appearing and in no acute distress. Eyes: Conjunctivae are normal. PERRL. EOMI. Head: Small, superficial, less than 0.5 cm abrasion is noted to the right occipital region. No bleeding. Area is moderately tender to palpation. No other tenderness to palpation of the osseous structures of the skull or face. No battle signs. No raccoon eyes. No serosanguineous fluid drainage from the ears or nares. ENT:      Ears:       Nose: No congestion/rhinnorhea.      Mouth/Throat: Mucous membranes are moist.  Neck: No stridor.  No cervical spine tenderness to  palpation.  Cardiovascular: Normal rate, regular rhythm. Normal S1 and S2.  Good peripheral circulation. Respiratory: Normal respiratory effort without tachypnea or retractions. Lungs CTAB. Good air entry to the bases with no decreased or absent breath sounds. Musculoskeletal: Full range of motion to all extremities. No gross deformities appreciated. Neurologic:  Normal speech and language. No gross focal neurologic deficits are appreciated. Cranial nerves II through XII are grossly intact Skin:  Skin is warm, dry and intact. No rash noted. Psychiatric: Mood and affect are normal. Speech and behavior are normal. Patient exhibits appropriate insight and judgement.   ____________________________________________   LABS (all labs ordered are listed, but only abnormal results are displayed)  Labs Reviewed - No data to display ____________________________________________  EKG   ____________________________________________  RADIOLOGY   No results found.  ____________________________________________    PROCEDURES  Procedure(s) performed:    Procedures    Medications - No data to display   ____________________________________________   INITIAL IMPRESSION / ASSESSMENT AND PLAN / ED COURSE  Pertinent labs & imaging results that were available during my care of the patient were reviewed by me and considered in my medical decision making (see chart for details).  Review of the [place] CSRS was performed in accordance of the [facility] prior to dispensing any controlled drugs.     Patient's diagnosis is consistent with minor head injury resulting in contusion of the scalp with minor abrasion. No treatment necessary. No indication for imaging. Patient's exam was reassuring. Patient's tetanus shot is up-to-date. He'll take Tylenol and Motrin at home as needed for pain. Patient will follow-up with primary care as needed. Patient is given ED precautions to return to the ED for any worsening  or new symptoms.     ____________________________________________  FINAL CLINICAL IMPRESSION(S) / ED DIAGNOSES  Final diagnoses:  Minor head injury, initial encounter  Abrasion of scalp, initial encounter  Contusion of scalp, initial encounter      NEW MEDICATIONS STARTED DURING THIS VISIT:  Discharge Medication List as of 10/08/2016  9:39 PM          This chart was dictated using voice recognition software/Dragon. Despite best efforts to proofread, errors can occur which can change the meaning. Any change was purely unintentional.    Racheal Patches, PA-C 10/08/16 2245    Jeanmarie Plant, MD 10/08/16 [phone]

## 2016-10-08 NOTE — ED Triage Notes (Signed)
Pt struck right side of head on a shelf today.  Pt has abrasion to right side of head.  No loc   No vomiting   Pt alert.  Speech clear.  Bleeding controlled.

## 2017-06-12 ENCOUNTER — Emergency Department: Payer: No Typology Code available for payment source

## 2017-06-12 ENCOUNTER — Other Ambulatory Visit: Payer: Self-pay

## 2017-06-12 ENCOUNTER — Encounter: Payer: Self-pay | Admitting: Emergency Medicine

## 2017-06-12 ENCOUNTER — Emergency Department
Admission: EM | Admit: 2017-06-12 | Discharge: 2017-06-12 | Disposition: A | Discharge status: Home / Self Care | Payer: No Typology Code available for payment source | Attending: Emergency Medicine | Admitting: Emergency Medicine

## 2017-06-12 DIAGNOSIS — F17210 Nicotine dependence, cigarettes, uncomplicated: Secondary | ICD-10-CM | POA: Insufficient documentation

## 2017-06-12 DIAGNOSIS — S3992XA Unspecified injury of lower back, initial encounter: Secondary | ICD-10-CM | POA: Diagnosis present

## 2017-06-12 DIAGNOSIS — R0789 Other chest pain: Secondary | ICD-10-CM

## 2017-06-12 DIAGNOSIS — S39012A Strain of muscle, fascia and tendon of lower back, initial encounter: Secondary | ICD-10-CM | POA: Diagnosis not present

## 2017-06-12 DIAGNOSIS — Y998 Other external cause status: Secondary | ICD-10-CM | POA: Diagnosis not present

## 2017-06-12 DIAGNOSIS — Y9389 Activity, other specified: Secondary | ICD-10-CM | POA: Insufficient documentation

## 2017-06-12 DIAGNOSIS — Y92410 Unspecified street and highway as the place of occurrence of the external cause: Secondary | ICD-10-CM | POA: Insufficient documentation

## 2017-06-12 MED ORDER — KETOROLAC TROMETHAMINE 30 MG/ML IJ SOLN
30.0000 mg | Freq: Once | INTRAMUSCULAR | Status: AC
  Administered 2017-06-12: 30 mg via INTRAMUSCULAR
  Filled 2017-06-12: qty 1

## 2017-06-12 MED ORDER — NAPROXEN 500 MG PO TABS: 500.0000 mg | ORAL_TABLET | Freq: Two times a day (BID) | ORAL | 0 refills | Status: DC

## 2017-06-12 MED ORDER — METHOCARBAMOL 500 MG PO TABS: 500.0000 mg | ORAL_TABLET | Freq: Four times a day (QID) | ORAL | 0 refills | Status: DC | PRN
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
## 2017-06-12 NOTE — ED Notes (Signed)
Pt D/C at 1320, not taken off the board. Pt left room after discharge.

## 2017-06-12 NOTE — Discharge Instructions (Signed)
Follow-up with your primary care provider if any continued problems.  Begin taking methocarbamol every 6 hours as needed for muscle spasms and naproxen 500 mg twice daily with food.  You may use ice or heat to your back as needed for discomfort.
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
## 2017-12-03 ENCOUNTER — Emergency Department
Admission: EM | Admit: 2017-12-03 | Discharge: 2017-12-03 | Disposition: A | Discharge status: Home / Self Care | Payer: Self-pay | Attending: Emergency Medicine | Admitting: Emergency Medicine

## 2017-12-03 ENCOUNTER — Emergency Department: Payer: Self-pay

## 2017-12-03 ENCOUNTER — Other Ambulatory Visit: Payer: Self-pay

## 2017-12-03 DIAGNOSIS — F17210 Nicotine dependence, cigarettes, uncomplicated: Secondary | ICD-10-CM | POA: Insufficient documentation

## 2017-12-03 DIAGNOSIS — Y999 Unspecified external cause status: Secondary | ICD-10-CM | POA: Insufficient documentation

## 2017-12-03 DIAGNOSIS — S060X0A Concussion without loss of consciousness, initial encounter: Secondary | ICD-10-CM

## 2017-12-03 DIAGNOSIS — Y929 Unspecified place or not applicable: Secondary | ICD-10-CM | POA: Insufficient documentation

## 2017-12-03 DIAGNOSIS — T07XXXA Unspecified multiple injuries, initial encounter: Secondary | ICD-10-CM

## 2017-12-03 DIAGNOSIS — Y9389 Activity, other specified: Secondary | ICD-10-CM | POA: Insufficient documentation

## 2017-12-03 DIAGNOSIS — Z79899 Other long term (current) drug therapy: Secondary | ICD-10-CM | POA: Insufficient documentation

## 2017-12-03 DIAGNOSIS — S0101XA Laceration without foreign body of scalp, initial encounter: Secondary | ICD-10-CM

## 2017-12-03 LAB — COMPREHENSIVE METABOLIC PANEL WITH GFR
ALT: 26 U/L (ref 0–44)
AST: 28 U/L (ref 15–41)
Albumin: 4.3 g/dL (ref 3.5–5.0)
Alkaline Phosphatase: 75 U/L (ref 38–126)
Anion gap: 6 (ref 5–15)
BUN: 9 mg/dL (ref 6–20)
CO2: 25 mmol/L (ref 22–32)
Calcium: 9.3 mg/dL (ref 8.9–10.3)
Chloride: 109 mmol/L (ref 98–111)
Creatinine, Ser: 0.83 mg/dL (ref 0.61–1.24)
GFR calc Af Amer: >60 mL/min
GFR calc non Af Amer: >60 mL/min
Glucose, Bld: 110 mg/dL — ABNORMAL HIGH (ref 70–99)
Potassium: 3.9 mmol/L (ref 3.5–5.1)
Sodium: 140 mmol/L (ref 135–145)
Total Bilirubin: 0.6 mg/dL (ref 0.3–1.2)
Total Protein: 7.3 g/dL (ref 6.5–8.1)

## 2017-12-03 LAB — CBC
HCT: 44.1 % (ref 40.0–52.0)
Hemoglobin: 15 g/dL (ref 13.0–18.0)
MCH: 31 pg (ref 26.0–34.0)
MCHC: 33.9 g/dL (ref 32.0–36.0)
MCV: 91.6 fL (ref 80.0–100.0)
Platelets: 208 K/uL (ref 150–440)
RBC: 4.82 MIL/uL (ref 4.40–5.90)
RDW: 14.2 % (ref 11.5–14.5)
WBC: 8.3 K/uL (ref 3.8–10.6)

## 2017-12-03 LAB — LIPASE, BLOOD: Lipase: 25 U/L (ref 11–51)

## 2017-12-03 MED ORDER — IOHEXOL 300 MG/ML  SOLN
75.0000 mL | Freq: Once | INTRAMUSCULAR | Status: AC | PRN
  Administered 2017-12-03: 75 mL via INTRAVENOUS

## 2017-12-03 NOTE — ED Notes (Signed)
Pt removed himself off of backboard. EDP at bedside. EDP removed c collar. Pt denies neck pain. Lacerations and abrasions cleansed with NS. Bleeding controlled.   SHP at bedside.

## 2017-12-03 NOTE — ED Notes (Signed)
Patient transported to CT 
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
## 2017-12-03 NOTE — ED Notes (Signed)
Patient denies pain and is resting comfortably.  
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
## 2017-12-03 NOTE — ED Notes (Signed)
Family at bedside. 

## 2017-12-03 NOTE — ED Notes (Signed)
Report to Christina, RN

## 2018-01-06 ENCOUNTER — Emergency Department
Admission: EM | Admit: 2018-01-06 | Discharge: 2018-01-06 | Disposition: A | Discharge status: Home / Self Care | Payer: Self-pay | Attending: Emergency Medicine | Admitting: Emergency Medicine

## 2018-01-06 ENCOUNTER — Encounter: Payer: Self-pay | Admitting: Emergency Medicine

## 2018-01-06 ENCOUNTER — Other Ambulatory Visit: Payer: Self-pay

## 2018-01-06 DIAGNOSIS — H5789 Other specified disorders of eye and adnexa: Secondary | ICD-10-CM

## 2018-01-06 DIAGNOSIS — F17210 Nicotine dependence, cigarettes, uncomplicated: Secondary | ICD-10-CM | POA: Insufficient documentation

## 2018-01-06 DIAGNOSIS — H02843 Edema of right eye, unspecified eyelid: Secondary | ICD-10-CM | POA: Insufficient documentation

## 2018-01-06 MED ORDER — OLOPATADINE HCL 0.2 % OP SOLN: 1.0000 [drp] | Freq: Every day | OPHTHALMIC | 0 refills | Status: AC

## 2018-01-06 MED ORDER — PREDNISOLONE ACETATE 0.12 % OP SUSP: 1.0000 [drp] | Freq: Four times a day (QID) | OPHTHALMIC | 0 refills | Status: DC

## 2018-01-06 NOTE — ED Triage Notes (Signed)
Pt reports went to play basketball Sunday and that night his eye started hurting a little and has been getting worse since then. Pt reports some drainage but no itching. Pt right eye red.

## 2018-01-06 NOTE — ED Provider Notes (Signed)
[facility] Emergency Department Provider Note   ____________________________________________   First MD Initiated Contact with Patient 01/06/18 1122     (approximate)  I have reviewed the triage vital signs and the nursing notes.   HISTORY  Chief Complaint Eye Problem and Conjunctivitis    HPI Gordon Dunn is a 32 y.o. male patient presents with mild discomfort and swelling to the right eye after playing basketball 3 days ago.  Patient denies direct trauma.  Patient denies vision disturbance.  Patient has some mild drainage and itching.  Patient denies matted eyelids.  No palates measured for complaint.  Past Medical History:  Diagnosis Date  . GERD (gastroesophageal reflux disease)   . Migraines     There are no active problems to display for this patient.   History reviewed. No pertinent surgical history.  Prior to Admission medications   Medication Sig Start Date End Date Taking? Authorizing Provider  albuterol (PROVENTIL HFA;VENTOLIN HFA) 108 (90 Base) MCG/ACT inhaler Inhale 2 puffs into the lungs every 6 (six) hours as needed for wheezing or shortness of breath. 09/26/15   Jeanmarie PlantMcShane, James A, MD  methocarbamol (ROBAXIN) 500 MG tablet Take 1 tablet (500 mg total) by mouth every 6 (six) hours as needed for muscle spasms. 06/12/17   Tommi RumpsSummers, Rhonda L, PA-C  naproxen (NAPROSYN) 500 MG tablet Take 1 tablet (500 mg total) by mouth 2 (two) times daily with a meal. 06/12/17   Bridget HartshornSummers, Rhonda L, PA-C  nystatin cream (MYCOSTATIN) Apply 1 application topically 2 (two) times daily. 07/04/16   Cuthriell, Delorise RoyalsJonathan D, PA-C  Olopatadine HCl 0.2 % SOLN Apply 1 drop to eye daily for 1 dose. 01/06/18 01/07/18  Joni ReiningSmith, Kamron Vanwyhe K, PA-C  pantoprazole (PROTONIX) 20 MG tablet Take 1 tablet (20 mg total) by mouth daily. 06/12/16 06/12/17  Jennye MoccasinQuigley, Brian S, MD  prednisoLONE acetate (PRED MILD) 0.12 % ophthalmic suspension Place 1 drop into both eyes 4 (four) times daily. 01/06/18    Joni ReiningSmith, Walker Paddack K, PA-C    Allergies Patient has no known allergies.  No family history on file.  Social History Social History   Tobacco Use  . Smoking status: Current Every Day Smoker    Packs/day: 0.50    Types: Cigarettes  . Smokeless tobacco: Never Used  Substance Use Topics  . Alcohol use: Yes  . Drug use: No    Review of Systems  Constitutional: No fever/chills Eyes: No visual changes.  Redness and swelling right eye. ENT: No sore throat. Cardiovascular: Denies chest pain. Respiratory: Denies shortness of breath. Gastrointestinal: No abdominal pain.  No nausea, no vomiting.  No diarrhea.  No constipation. Genitourinary: Negative for dysuria. Musculoskeletal: Negative for back pain. Skin: Negative for rash. Neurological: Negative for headaches, focal weakness or numbness.   ____________________________________________   PHYSICAL EXAM:  VITAL SIGNS: ED Triage Vitals  Enc Vitals Group     BP 01/06/18 1032 133/79     Pulse Rate 01/06/18 1032 95     Resp 01/06/18 1032 20     Temp 01/06/18 1032 98.6 F (37 C)     Temp Source 01/06/18 1032 Oral     SpO2 01/06/18 1032 99 %     Weight 01/06/18 1033 170 lb (77.1 kg)     Height 01/06/18 1033 5\' 9"  (1.753 m)     Head Circumference --      Peak Flow --      Pain Score 01/06/18 1033 0     Pain Loc --  Pain Edu? --      Excl. in GC? --    Constitutional: Alert and oriented. Well appearing and in no acute distress. Eyes: Conjunctivae are erythematous and edematous.   PERRL. EOMI. patient is not photophobic.   Cardiovascular: Normal rate, regular rhythm. Grossly normal heart sounds.  Good peripheral circulation. Respiratory: Normal respiratory effort.  No retractions. Lungs CTAB. Skin:  Skin is warm, dry and intact. No rash noted. Psychiatric: Mood and affect are normal. Speech and behavior are normal.  ____________________________________________   LABS (all labs ordered are listed, but only abnormal  results are displayed)  Labs Reviewed - No data to display ____________________________________________  EKG   ____________________________________________  RADIOLOGY  ED MD interpretation:    Official radiology report(s): No results found.  ____________________________________________   PROCEDURES  Procedure(s) performed: None  Procedures  Critical Care performed: No  ____________________________________________   INITIAL IMPRESSION / ASSESSMENT AND PLAN / ED COURSE  As part of my medical decision making, I reviewed the following data within the electronic MEDICAL RECORD NUMBER    Mild right eye pain secondary to ocular inflammation.  Patient given discharge care instruction advised use eyedrops as directed.  Patient advised to follow-up with the ophthalmologist if no improvement in 3 days.  Return to ED if condition worsens.      ____________________________________________   FINAL CLINICAL IMPRESSION(S) / ED DIAGNOSES  Final diagnoses:  Ocular inflammation     ED Discharge Orders         Ordered    prednisoLONE acetate (PRED MILD) 0.12 % ophthalmic suspension  4 times daily     01/06/18 1132    Olopatadine HCl 0.2 % SOLN  Daily     01/06/18 1132           Note:  This document was prepared using Dragon voice recognition software and may include unintentional dictation errors.    Joni Reining, PA-C 01/06/18 1139    Emily Filbert, MD 01/06/18 [phone]

## 2018-04-19 ENCOUNTER — Emergency Department: Payer: Self-pay

## 2018-04-19 ENCOUNTER — Emergency Department: Admission: EM | Admit: 2018-04-19 | Discharge: 2018-04-19 | Discharge status: Against Medical Advice | Payer: Self-pay

## 2018-04-19 NOTE — ED Triage Notes (Deleted)
Patient ambulatory to triage with steady gait, without difficulty or distress noted; pt [facility]; denies hx of same

## 2018-09-28 ENCOUNTER — Emergency Department
Admission: EM | Admit: 2018-09-28 | Discharge: 2018-09-28 | Disposition: A | Discharge status: Home / Self Care | Payer: Self-pay | Attending: Emergency Medicine | Admitting: Emergency Medicine

## 2018-09-28 ENCOUNTER — Encounter: Payer: Self-pay | Admitting: Medical Oncology

## 2018-09-28 ENCOUNTER — Other Ambulatory Visit: Payer: Self-pay

## 2018-09-28 ENCOUNTER — Emergency Department: Payer: Self-pay

## 2018-09-28 DIAGNOSIS — M25522 Pain in left elbow: Secondary | ICD-10-CM | POA: Insufficient documentation

## 2018-09-28 DIAGNOSIS — F17210 Nicotine dependence, cigarettes, uncomplicated: Secondary | ICD-10-CM | POA: Insufficient documentation

## 2018-09-28 NOTE — ED Notes (Signed)
See triage note   States he developed left elbow pain couple of days ago  denies any recent injury   But states he has a "bone poking out"

## 2018-09-28 NOTE — Discharge Instructions (Addendum)
Follow-up with Dr. Harlow Mares who is the orthopedist on call if any continued problems with your elbow.  You may take over-the-counter ibuprofen as needed for discomfort.  Use ice if needed.
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
## 2018-09-28 NOTE — ED Triage Notes (Signed)
Pt reports "bone poking out" of left elbow. Pain with pressure to bone.

## 2019-03-07 ENCOUNTER — Encounter: Payer: Self-pay | Admitting: Emergency Medicine

## 2019-03-07 ENCOUNTER — Emergency Department
Admission: EM | Admit: 2019-03-07 | Discharge: 2019-03-07 | Disposition: A | Discharge status: Home / Self Care | Payer: Self-pay | Attending: Emergency Medicine | Admitting: Emergency Medicine

## 2019-03-07 ENCOUNTER — Other Ambulatory Visit: Payer: Self-pay

## 2019-03-07 DIAGNOSIS — G44209 Tension-type headache, unspecified, not intractable: Secondary | ICD-10-CM | POA: Insufficient documentation

## 2019-03-07 DIAGNOSIS — F17210 Nicotine dependence, cigarettes, uncomplicated: Secondary | ICD-10-CM | POA: Insufficient documentation

## 2019-03-07 MED ORDER — IBUPROFEN 600 MG PO TABS: 600.0000 mg | ORAL_TABLET | Freq: Three times a day (TID) | ORAL | 0 refills | Status: AC | PRN

## 2019-03-07 MED ORDER — DEXAMETHASONE 4 MG PO TABS
10.0000 mg | ORAL_TABLET | Freq: Once | ORAL | Status: AC
  Administered 2019-03-07: 23:00:00 10 mg via ORAL
  Filled 2019-03-07 (×2): qty 2.5

## 2019-03-07 MED ORDER — IBUPROFEN 600 MG PO TABS
600.0000 mg | ORAL_TABLET | Freq: Once | ORAL | Status: AC
  Administered 2019-03-07: 600 mg via ORAL
  Filled 2019-03-07: qty 1

## 2019-03-07 MED ORDER — ONDANSETRON 4 MG PO TBDP
4.0000 mg | ORAL_TABLET | Freq: Three times a day (TID) | ORAL | 0 refills | Status: AC | PRN
Start: 2019-03-07 — End: ?

## 2019-03-07 NOTE — ED Triage Notes (Addendum)
Patient ambulatory to triage with steady gait, without difficulty or distress noted, mask in place; pt reports left sided HA today with no accomp symptoms; hx migraines

## 2019-03-08 NOTE — ED Provider Notes (Signed)
[facility] Emergency Department Provider Note  ____________________________________________   First MD Initiated Contact with Patient 03/07/19 2025     (approximate)  I have reviewed the triage vital signs and the nursing notes.   HISTORY  Chief Complaint Headache    HPI Gordon Dunn is a 33 y.o. male  With h/o GERD ,migraines here with HA. Pt states he awoke with an aching, throbbing, band like HA in his bilateral temple areas, which wraps around to the back of his head. He states it has been constant, unchanging throughout the day. Feels somewhat similar to his old migraines. No h/o trauma. No neck pain or stiffness. No focal weakness or numbness. He had to miss work today due to this so is requesting work note.        Past Medical History:  Diagnosis Date   GERD (gastroesophageal reflux disease)    Migraines     There are no active problems to display for this patient.   History reviewed. No pertinent surgical history.  Prior to Admission medications   Medication Sig Start Date End Date Taking? Authorizing Provider  albuterol (PROVENTIL HFA;VENTOLIN HFA) 108 (90 Base) MCG/ACT inhaler Inhale 2 puffs into the lungs every 6 (six) hours as needed for wheezing or shortness of breath. 09/26/15   Jeanmarie PlantMcShane, James A, MD  ibuprofen (ADVIL) 600 MG tablet Take 1 tablet (600 mg total) by mouth every 8 (eight) hours as needed. 03/07/19   Shaune PollackIsaacs, Korina Tretter, MD  ondansetron (ZOFRAN ODT) 4 MG disintegrating tablet Take 1 tablet (4 mg total) by mouth every 8 (eight) hours as needed for nausea or vomiting. 03/07/19   Shaune PollackIsaacs, Kmya Placide, MD    Allergies Patient has no known allergies.  No family history on file.  Social History Social History   Tobacco Use   Smoking status: Current Every Day Smoker    Packs/day: 0.50    Types: Cigarettes   Smokeless tobacco: Never Used  Substance Use Topics   Alcohol use: Yes   Drug use: No    Review of Systems    Review of Systems  Constitutional: Negative for chills, fatigue and fever.  HENT: Negative for sore throat.   Respiratory: Negative for shortness of breath.   Cardiovascular: Negative for chest pain.  Gastrointestinal: Negative for abdominal pain.  Genitourinary: Negative for flank pain.  Musculoskeletal: Negative for neck pain.  Skin: Negative for rash and wound.  Allergic/Immunologic: Negative for immunocompromised state.  Neurological: Positive for headaches. Negative for weakness and numbness.  Hematological: Does not bruise/bleed easily.     ____________________________________________  PHYSICAL EXAM:      VITAL SIGNS: ED Triage Vitals [03/07/19 2008]  Enc Vitals Group     BP 123/63     Pulse Rate 82     Resp 18     Temp 98.7 F (37.1 C)     Temp Source Oral     SpO2 97 %     Weight 170 lb (77.1 kg)     Height 5\' 9"  (1.753 m)     Head Circumference      Peak Flow      Pain Score 8     Pain Loc      Pain Edu?      Excl. in GC?      Physical Exam Vitals signs and nursing note reviewed.  Constitutional:      General: He is not in acute distress.    Appearance: He is well-developed.  HENT:  Head: Normocephalic and atraumatic.  Eyes:     Conjunctiva/sclera: Conjunctivae normal.  Neck:     Musculoskeletal: Neck supple.  Cardiovascular:     Rate and Rhythm: Normal rate and regular rhythm.     Heart sounds: Normal heart sounds.  Pulmonary:     Effort: Pulmonary effort is normal. No respiratory distress.     Breath sounds: No wheezing.  Abdominal:     General: There is no distension.  Skin:    General: Skin is warm.     Capillary Refill: Capillary refill takes less than 2 seconds.     Findings: No rash.  Neurological:     Mental Status: He is alert and oriented to person, place, and time.     Motor: No abnormal muscle tone.     Comments: Neurological Exam:  Mental Status: Alert and oriented to person, place, and time. Attention and concentration  normal. Speech clear. Recent memory is intact. Cranial Nerves: Visual fields grossly intact. EOMI and PERRLA. No nystagmus noted. Facial sensation intact at forehead, maxillary cheek, and chin/mandible bilaterally. No facial asymmetry or weakness. Hearing grossly normal. Uvula is midline, and palate elevates symmetrically. Normal SCM and trapezius strength. Tongue midline without fasciculations. Motor: Muscle strength 5/5 in proximal and distal UE and LE bilaterally. No pronator drift. Muscle tone normal. Sensation: Intact to light touch in upper and lower extremities distally bilaterally.  Gait: Normal without ataxia. Coordination: Normal FTN bilaterally.          ____________________________________________   LABS (all labs ordered are listed, but only abnormal results are displayed)  Labs Reviewed - No data to display  ____________________________________________  EKG: None ________________________________________  RADIOLOGY All imaging, including plain films, CT scans, and ultrasounds, independently reviewed by me, and interpretations confirmed via formal radiology reads.  ED MD interpretation:   None  Official radiology report(s): No results found.  ____________________________________________  PROCEDURES   Procedure(s) performed (including Critical Care):  Procedures  ____________________________________________  INITIAL IMPRESSION / MDM / [place] / ED COURSE  As part of my medical decision making, I reviewed the following data within the [place] notes reviewed and incorporated, Old chart reviewed, Notes from prior ED visits, and [place] Controlled Substance Database       *Artez Regis was evaluated in Emergency Department on 03/08/2019 for the symptoms described in the history of present illness. He was evaluated in the context of the global COVID-19 pandemic, which necessitated consideration that the patient might be at  risk for infection with the SARS-CoV-2 virus that causes COVID-19. Institutional protocols and algorithms that pertain to the evaluation of patients at risk for COVID-19 are in a state of rapid change based on information released by regulatory bodies including the CDC and federal and state organizations. These policies and algorithms were followed during the patient's care in the ED.  Some ED evaluations and interventions may be delayed as a result of limited staffing during the pandemic.*     Medical Decision Making:  33 yo M here with acute on chronic HA. 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 No concomitant sx to suggest cluster HA. He has a h/o HA of similar quality, severity. No apparent triggers. He is afebrile, well appearing with no neck pain or neck stiffness, doubt meningitis, encephalitis. HA began gradually, no thunderclap onset, no neuro deficits, doubt CVA, SAH, or intracranial mass/lesion. Will advise NSAIDs, rest, and PCP f/u. Pt aware and in agreement. Declines migraine meds,  requesting outpt Rx and work note.  ____________________________________________  FINAL CLINICAL IMPRESSION(S) / ED DIAGNOSES  Final diagnoses:  Tension headache     MEDICATIONS GIVEN DURING THIS VISIT:  Medications  dexamethasone (DECADRON) tablet 10 mg (10 mg Oral Given 03/07/19 2249)  ibuprofen (ADVIL) tablet 600 mg (600 mg Oral Given 03/07/19 2221)     ED Discharge Orders         Ordered    ibuprofen (ADVIL) 600 MG tablet  Every 8 hours PRN     03/07/19 2210    ondansetron (ZOFRAN ODT) 4 MG disintegrating tablet  Every 8 hours PRN     03/07/19 2210           Note:  This document was prepared using Dragon voice recognition software and may include unintentional dictation errors.   Shaune Pollack, MD 03/08/19 [phone]
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
## 2020-10-08 ENCOUNTER — Emergency Department (HOSPITAL_COMMUNITY)
Admission: EM | Admit: 2020-10-08 | Discharge: 2020-10-09 | Disposition: A | Discharge status: Home / Self Care | Payer: Self-pay | Attending: Emergency Medicine | Admitting: Emergency Medicine

## 2020-10-08 DIAGNOSIS — R109 Unspecified abdominal pain: Secondary | ICD-10-CM | POA: Insufficient documentation

## 2020-10-08 DIAGNOSIS — R509 Fever, unspecified: Secondary | ICD-10-CM | POA: Insufficient documentation

## 2020-10-08 DIAGNOSIS — R0981 Nasal congestion: Secondary | ICD-10-CM | POA: Insufficient documentation

## 2020-10-08 DIAGNOSIS — Z5321 Procedure and treatment not carried out due to patient leaving prior to being seen by health care provider: Secondary | ICD-10-CM | POA: Insufficient documentation

## 2020-10-08 DIAGNOSIS — R531 Weakness: Secondary | ICD-10-CM | POA: Insufficient documentation

## 2020-10-08 DIAGNOSIS — R079 Chest pain, unspecified: Secondary | ICD-10-CM | POA: Insufficient documentation

## 2020-10-08 DIAGNOSIS — R197 Diarrhea, unspecified: Secondary | ICD-10-CM | POA: Insufficient documentation

## 2020-10-08 DIAGNOSIS — R059 Cough, unspecified: Secondary | ICD-10-CM | POA: Insufficient documentation

## 2020-10-08 NOTE — ED Triage Notes (Addendum)
Per ems, pt from home. Pt reports cough and congestion that started this am and now fever, generalized weakness, abd pain, diarrhea and cp. No known covid exposures. EMS VSS BP 120/70, HR 104, 97% RA, CBG 81, R 20. Pt took Tylenol at 8pm.

## 2022-01-01 ENCOUNTER — Emergency Department
Admission: EM | Admit: 2022-01-01 | Discharge: 2022-01-01 | Disposition: A | Discharge status: Home / Self Care | Payer: No Typology Code available for payment source | Attending: Emergency Medicine | Admitting: Emergency Medicine

## 2022-01-01 ENCOUNTER — Other Ambulatory Visit: Payer: Self-pay

## 2022-01-01 ENCOUNTER — Emergency Department: Payer: No Typology Code available for payment source

## 2022-01-01 DIAGNOSIS — M546 Pain in thoracic spine: Secondary | ICD-10-CM | POA: Insufficient documentation

## 2022-01-01 DIAGNOSIS — R519 Headache, unspecified: Secondary | ICD-10-CM | POA: Insufficient documentation

## 2022-01-01 DIAGNOSIS — M542 Cervicalgia: Secondary | ICD-10-CM | POA: Diagnosis not present

## 2022-01-01 DIAGNOSIS — Y92410 Unspecified street and highway as the place of occurrence of the external cause: Secondary | ICD-10-CM | POA: Insufficient documentation

## 2022-01-01 MED ORDER — METHOCARBAMOL 500 MG PO TABS: 500.0000 mg | ORAL_TABLET | Freq: Three times a day (TID) | ORAL | 0 refills | Status: AC | PRN

## 2022-01-01 MED ORDER — MELOXICAM 15 MG PO TABS: 15.0000 mg | ORAL_TABLET | Freq: Every day | ORAL | 1 refills | Status: AC
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
## 2022-01-01 NOTE — Discharge Instructions (Signed)
Take meloxicam and Robaxin as directed. 

## 2022-01-01 NOTE — ED Notes (Signed)
DC ppw provided. follow and rx information reviewed as applicable. pt provides verbal consent for dc at this time. Pt ambulatory off unit.  

## 2022-01-27 DIAGNOSIS — R059 Cough, unspecified: Secondary | ICD-10-CM | POA: Diagnosis not present

## 2022-01-27 DIAGNOSIS — K529 Noninfective gastroenteritis and colitis, unspecified: Secondary | ICD-10-CM | POA: Diagnosis not present

## 2022-01-27 DIAGNOSIS — M791 Myalgia, unspecified site: Secondary | ICD-10-CM | POA: Diagnosis not present

## 2022-01-27 DIAGNOSIS — R61 Generalized hyperhidrosis: Secondary | ICD-10-CM | POA: Diagnosis not present

## 2022-01-27 DIAGNOSIS — R112 Nausea with vomiting, unspecified: Secondary | ICD-10-CM | POA: Diagnosis not present

## 2022-01-27 DIAGNOSIS — R509 Fever, unspecified: Secondary | ICD-10-CM | POA: Diagnosis not present

## 2022-01-27 DIAGNOSIS — F17210 Nicotine dependence, cigarettes, uncomplicated: Secondary | ICD-10-CM | POA: Diagnosis not present

## 2022-01-27 DIAGNOSIS — J029 Acute pharyngitis, unspecified: Secondary | ICD-10-CM | POA: Diagnosis not present

## 2022-01-27 DIAGNOSIS — Z20822 Contact with and (suspected) exposure to covid-19: Secondary | ICD-10-CM | POA: Diagnosis not present

## 2022-01-27 DIAGNOSIS — R0981 Nasal congestion: Secondary | ICD-10-CM | POA: Diagnosis not present

## 2022-07-13 ENCOUNTER — Emergency Department: Payer: 59

## 2022-07-13 ENCOUNTER — Emergency Department
Admission: EM | Admit: 2022-07-13 | Discharge: 2022-07-13 | Disposition: A | Discharge status: Home / Self Care | Payer: 59 | Attending: Emergency Medicine | Admitting: Emergency Medicine

## 2022-07-13 DIAGNOSIS — M542 Cervicalgia: Secondary | ICD-10-CM | POA: Insufficient documentation

## 2022-07-13 DIAGNOSIS — Z23 Encounter for immunization: Secondary | ICD-10-CM | POA: Insufficient documentation

## 2022-07-13 DIAGNOSIS — R58 Hemorrhage, not elsewhere classified: Secondary | ICD-10-CM | POA: Diagnosis not present

## 2022-07-13 DIAGNOSIS — S0101XA Laceration without foreign body of scalp, initial encounter: Secondary | ICD-10-CM | POA: Diagnosis not present

## 2022-07-13 DIAGNOSIS — R0781 Pleurodynia: Secondary | ICD-10-CM | POA: Diagnosis not present

## 2022-07-13 DIAGNOSIS — S0990XA Unspecified injury of head, initial encounter: Secondary | ICD-10-CM | POA: Insufficient documentation

## 2022-07-13 DIAGNOSIS — S199XXA Unspecified injury of neck, initial encounter: Secondary | ICD-10-CM | POA: Diagnosis not present

## 2022-07-13 MED ORDER — LIDOCAINE-EPINEPHRINE-TETRACAINE (LET) TOPICAL GEL
3.0000 mL | Freq: Once | TOPICAL | Status: AC
  Administered 2022-07-13: 3 mL via TOPICAL
  Filled 2022-07-13: qty 3

## 2022-07-13 MED ORDER — LIDOCAINE 5 % EX PTCH: 1.0000 | MEDICATED_PATCH | Freq: Two times a day (BID) | CUTANEOUS | 0 refills | Status: AC

## 2022-07-13 MED ORDER — OXYCODONE HCL 5 MG PO TABS
5.0000 mg | ORAL_TABLET | Freq: Once | ORAL | Status: AC
  Administered 2022-07-13: 5 mg via ORAL
  Filled 2022-07-13: qty 1

## 2022-07-13 MED ORDER — TETANUS-DIPHTH-ACELL PERTUSSIS 5-2.5-18.5 LF-MCG/0.5 IM SUSY
0.5000 mL | PREFILLED_SYRINGE | Freq: Once | INTRAMUSCULAR | Status: AC
  Administered 2022-07-13: 0.5 mL via INTRAMUSCULAR
  Filled 2022-07-13: qty 0.5

## 2022-07-13 MED ORDER — ACETAMINOPHEN 500 MG PO TABS
1000.0000 mg | ORAL_TABLET | Freq: Once | ORAL | Status: AC
  Administered 2022-07-13: 1000 mg via ORAL
  Filled 2022-07-13: qty 2
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
## 2022-07-13 NOTE — Discharge Instructions (Addendum)
Take tylenol 1g every 8 hours. Ibuprofen 600 every 6-8 hours. Lidocaine patches as needed.  Staple removed in 10-14 days- return to ER for fevers, worsening pain, change in location of pain, or any other concerns.
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
## 2022-07-23 ENCOUNTER — Emergency Department
Admission: EM | Admit: 2022-07-23 | Discharge: 2022-07-23 | Disposition: A | Discharge status: Home / Self Care | Payer: Medicaid Other | Attending: Emergency Medicine | Admitting: Emergency Medicine

## 2022-07-23 DIAGNOSIS — Z4802 Encounter for removal of sutures: Secondary | ICD-10-CM | POA: Insufficient documentation

## 2022-07-23 DIAGNOSIS — S0101XD Laceration without foreign body of scalp, subsequent encounter: Secondary | ICD-10-CM | POA: Diagnosis not present

## 2022-07-23 NOTE — ED Provider Notes (Signed)
   [facility] Provider Note    Event Date/Time   First MD Initiated Contact with Patient 07/23/22 1320     (approximate)   History   No chief complaint on file.   HPI  Gordon Dunn is a 37 y.o. male patient comes ED for removal of staples from the head.  Had a laceration that was repaired with 2 staples previously.  Denies any complaints.  No vision changes or headaches or neck pain.     Physical Exam   Triage Vital Signs: ED Triage Vitals [07/23/22 1154]  Enc Vitals Group     BP (!) 135/98     Pulse Rate 82     Resp 18     Temp 98 F (36.7 C)     Temp src      SpO2 98 %     Weight      Height      Head Circumference      Peak Flow      Pain Score 0     Pain Loc      Pain Edu?      Excl. in [place]?     Most recent vital signs: Vitals:   07/23/22 1154  BP: (!) 135/98  Pulse: 82  Resp: 18  Temp: 98 F (36.7 C)  SpO2: 98%    General: Awake, no distress.  CV:  Good peripheral perfusion.  Resp:  Normal effort.  Abd:  No distention.  Other:  Linear laceration on the apex of the scalp is well-healed, no swelling or inflammatory changes.  Staples removed   ED Results / Procedures / Treatments   Labs (all labs ordered are listed, but only abnormal results are displayed) Labs Reviewed - No data to display   RADIOLOGY    PROCEDURES:  Procedures   MEDICATIONS ORDERED IN ED: Medications - No data to display   IMPRESSION / MDM / [place] / ED COURSE  I reviewed the triage vital signs and the nursing notes.                                     FINAL CLINICAL IMPRESSION(S) / ED DIAGNOSES   Final diagnoses:  Encounter for staple removal     Rx / DC Orders   ED Discharge Orders     None        Note:  This document was prepared using Dragon voice recognition software and may include unintentional dictation errors.   Carrie Mew, MD 07/23/22 1321

## 2022-07-23 NOTE — ED Triage Notes (Signed)
Pt to ED via POV from home. Pt here for suture removal on top of head. Pt denies complaints. Sutures intact.

## 2022-11-17 ENCOUNTER — Encounter: Payer: Self-pay | Admitting: Emergency Medicine

## 2022-11-17 ENCOUNTER — Emergency Department
Admission: EM | Admit: 2022-11-17 | Discharge: 2022-11-17 | Disposition: A | Discharge status: Home / Self Care | Payer: 59 | Attending: Emergency Medicine | Admitting: Emergency Medicine

## 2022-11-17 ENCOUNTER — Other Ambulatory Visit: Payer: Self-pay

## 2022-11-17 DIAGNOSIS — R220 Localized swelling, mass and lump, head: Secondary | ICD-10-CM | POA: Diagnosis not present

## 2022-11-17 DIAGNOSIS — R590 Localized enlarged lymph nodes: Secondary | ICD-10-CM

## 2022-11-17 DIAGNOSIS — S0001XA Abrasion of scalp, initial encounter: Secondary | ICD-10-CM | POA: Insufficient documentation

## 2022-11-17 DIAGNOSIS — X58XXXA Exposure to other specified factors, initial encounter: Secondary | ICD-10-CM | POA: Insufficient documentation

## 2022-11-17 NOTE — ED Triage Notes (Signed)
Patient to ED via POV for "knot" on back of head. First noticed yesterday. Pain 5/10.

## 2022-11-17 NOTE — ED Provider Notes (Signed)
[facility] Emergency Department Provider Note     Event Date/Time   First MD Initiated Contact with Patient 11/17/22 1109     (approximate)   History   Cyst   HPI  Gordon Dunn is a 37 y.o. male with a history of migraines, presents to the ED for evaluation of a "knot" to the back of the head.  Patient noticed that he tender area yesterday.  He notes the pain currently is about 5 out of a 10, when he touches the area.  No fevers, chills, sweats reported.  No recent trauma, injury, or manipulation to the area.  Patient notes has been about 2 with since he had a haircut.  Physical Exam   Triage Vital Signs: ED Triage Vitals  Encounter Vitals Group     BP 11/17/22 1034 137/86     Systolic BP Percentile --      Diastolic BP Percentile --      Pulse Rate 11/17/22 1034 78     Resp 11/17/22 1034 18     Temp 11/17/22 1034 98.1 F (36.7 C)     Temp Source 11/17/22 1034 Oral     SpO2 11/17/22 1034 99 %     Weight 11/17/22 1033 168 lb (76.2 kg)     Height 11/17/22 1033 5\' 8"  (1.727 m)     Head Circumference --      Peak Flow --      Pain Score 11/17/22 1033 5     Pain Loc --      Pain Education --      Exclude from Growth Chart --     Most recent vital signs: Vitals:   11/17/22 1034  BP: 137/86  Pulse: 78  Resp: 18  Temp: 98.1 F (36.7 C)  SpO2: 99%    General Awake, no distress. NAD HEENT NCAT, except for small scabbed lesion to the back left occiput in the area of concern.  It appears to show a scab and likely local follicular irritation. PERRL. EOMI. No rhinorrhea. Mucous membranes are moist.  CV:  Good peripheral perfusion. RRR RESP:  Normal effort.  ABD:  No distention.     ED Results / Procedures / Treatments   Labs (all labs ordered are listed, but only abnormal results are displayed) Labs Reviewed - No data to display   EKG   RADIOLOGY   No results found.   PROCEDURES:  Critical Care performed:  No  Procedures   MEDICATIONS ORDERED IN ED: Medications - No data to display   IMPRESSION / MDM / ASSESSMENT AND PLAN / ED COURSE  I reviewed the triage vital signs and the nursing notes.                              Differential diagnosis includes, but is not limited to, kerion, sebaceous cyst, folliculitis, ingrown hair, scalp abrasion  Patient's presentation is most consistent with acute, uncomplicated illness.  Patient's diagnosis is consistent with local scalp abrasion with some reactive lymph nodes to the occiput. Patient will be discharged home with instructions to apply warm compresses to the area and shampoo the area daily as needed to help promote healing.. Patient is to follow up with primary provider or local urgent care as needed or otherwise directed. Patient is given ED precautions to return to the ED for any worsening or new symptoms.   FINAL CLINICAL IMPRESSION(S) / ED DIAGNOSES  Final diagnoses:  Lymphadenopathy of head and neck region  Abrasion of scalp, initial encounter     Rx / DC Orders   ED Discharge Orders     None        Note:  This document was prepared using Dragon voice recognition software and may include unintentional dictation errors.    Lissa Hoard, PA-C 11/17/22 2007    Jene Every, MD 11/17/22 2023

## 2022-11-17 NOTE — Discharge Instructions (Signed)
Your exam is consistent with a reactive lymph node on the back of the head. You have a small, inflamed ingrown hair on your hairline. The lymph node will resolve without treatment. You may apply warm compresses to promote healing. Use shampoo to wash your head/scalp.

## 2023-02-24 DIAGNOSIS — F17210 Nicotine dependence, cigarettes, uncomplicated: Secondary | ICD-10-CM | POA: Diagnosis not present

## 2023-02-24 DIAGNOSIS — K219 Gastro-esophageal reflux disease without esophagitis: Secondary | ICD-10-CM | POA: Diagnosis not present

## 2023-02-24 DIAGNOSIS — K5900 Constipation, unspecified: Secondary | ICD-10-CM | POA: Diagnosis not present

## 2023-02-24 DIAGNOSIS — R1012 Left upper quadrant pain: Secondary | ICD-10-CM | POA: Diagnosis not present

## 2023-02-24 DIAGNOSIS — R1032 Left lower quadrant pain: Secondary | ICD-10-CM | POA: Diagnosis not present
